# Patient Record
Sex: Male | Born: 1985 | Race: White | Hispanic: No | Marital: Married | Smoking: Never smoker
Health system: Southern US, Community
[De-identification: ages and names within clinical notes are randomized; demographics above are authoritative.]

---

## 2019-11-06 ENCOUNTER — Other Ambulatory Visit: Payer: Self-pay

## 2019-11-06 ENCOUNTER — Emergency Department (INDEPENDENT_AMBULATORY_CARE_PROVIDER_SITE_OTHER)
Admission: EM | Admit: 2019-11-06 | Discharge: 2019-11-06 | Disposition: A | Discharge status: Home / Self Care | Payer: PRIVATE HEALTH INSURANCE | Source: Home / Self Care | Attending: Family Medicine | Admitting: Family Medicine

## 2019-11-06 ENCOUNTER — Emergency Department (INDEPENDENT_AMBULATORY_CARE_PROVIDER_SITE_OTHER): Payer: PRIVATE HEALTH INSURANCE

## 2019-11-06 DIAGNOSIS — M545 Low back pain, unspecified: Secondary | ICD-10-CM

## 2019-11-06 DIAGNOSIS — S2232XA Fracture of one rib, left side, initial encounter for closed fracture: Secondary | ICD-10-CM | POA: Diagnosis not present

## 2019-11-06 DIAGNOSIS — G8929 Other chronic pain: Secondary | ICD-10-CM | POA: Diagnosis not present

## 2019-11-06 DIAGNOSIS — M542 Cervicalgia: Secondary | ICD-10-CM

## 2019-11-06 NOTE — ED Provider Notes (Signed)
[facility]    CSN: [id number] Arrival date & time: 11/06/19  0941      History   Chief Complaint Chief Complaint  Patient presents with  . Back Pain    HPI Benjamin Gibbs is a 34 y.o. male.   Patient presents with a variety of musculoskeletal complaints.  His main concerns are persistent recurring intermittent pain in his left neck/trapezius area, chronic pain in his left lateral chest and back that has become worse recently, and vague intermittent non-radiating pain in his left lower back.  He recalls no recent injury, but admits that he stretches and practices yoga daily.  He admits that he had shingles on his left chest about 8 years ago.     Past medical history: Bipolar disorder  Current problems:  none        Home Medications    Prior to Admission medications   None    Family History No family history on file.  Social History Social History   Tobacco Use  . Smoking status: Never Smoker  . Smokeless tobacco: Never Used  Substance Use Topics  . Alcohol use: Not Currently  . Drug use: Not on file     Allergies   Patient has no known allergies.   Review of Systems Review of Systems  Constitutional: Negative for activity change, appetite change, chills, diaphoresis, fatigue, fever and unexpected weight change.  HENT: Negative.   Eyes: Negative.   Respiratory: Negative.   Cardiovascular: Negative.   Gastrointestinal: Negative.   Genitourinary: Negative.   Musculoskeletal: Positive for back pain and neck pain. Negative for joint swelling.       Left rib pain  Skin: Negative for rash.  Neurological: Negative.      Physical Exam Triage Vital Signs ED Triage Vitals  Enc Vitals Group     BP 11/06/19 1017 138/72     Pulse Rate 11/06/19 1017 72     Resp 11/06/19 1017 18     Temp 11/06/19 1017 98.1 F (36.7 C)     Temp Source 11/06/19 1017 Oral     SpO2 11/06/19 1017 99 %     Weight 11/06/19 1024 174 lb (78.9 kg)     Height  11/06/19 1024 5\' 10"  (1.778 m)     Head Circumference --      Peak Flow --      Pain Score 11/06/19 1024 3     Pain Loc --      Pain Edu? --      Excl. in GC? --    No data found.  Updated Vital Signs BP 138/72 (BP Location: Left Arm)   Pulse 72   Temp 98.1 F (36.7 C) (Oral)   Resp 18   Ht 5\' 10"  (1.778 m)   Wt 78.9 kg   SpO2 99%   BMI 24.97 kg/m   Visual Acuity Right Eye Distance:   Left Eye Distance:   Bilateral Distance:    Right Eye Near:   Left Eye Near:    Bilateral Near:     Physical Exam Vitals and nursing note reviewed.  Constitutional:      General: He is not in acute distress. HENT:     Head: Normocephalic.     Right Ear: External ear normal.     Left Ear: External ear normal.     Nose: Nose normal.     Mouth/Throat:     Pharynx: Oropharynx is clear.  Eyes:     Pupils:  Pupils are equal, round, and reactive to light.  Neck:      Comments: Patient experiences pain in his left trapezius area, although there is no tenderness to palpation there.  He does have some cervical midline tenderness.  His pain is elicited by neck flexion to the left and rotating his head to the left. Cardiovascular:     Rate and Rhythm: Normal rate.     Heart sounds: Normal heart sounds.  Pulmonary:     Breath sounds: Normal breath sounds.    Chest:       Comments: Patient has localized tenderness to palpation over his left lateral chest. Abdominal:     Palpations: Abdomen is soft.     Tenderness: There is no abdominal tenderness.  Musculoskeletal:     Cervical back: Normal range of motion.     Right lower leg: No edema.     Left lower leg: No edema.     Comments: Back has full range of motion and no tenderness to palpation. Straight leg raise and sitting knee extension tests are negative. Patellar and achilles reflexes are normal.  Lymphadenopathy:     Cervical: No cervical adenopathy.  Skin:    General: Skin is warm and dry.     Findings: No rash.    Neurological:     Mental Status: He is alert.      UC Treatments / Results  Labs (all labs ordered are listed, but only abnormal results are displayed) Labs Reviewed - No data to display  EKG   Radiology DG Ribs Unilateral W/Chest Left  Result Date: 11/06/2019 CLINICAL DATA:  Patient is having posterior rib pain that started X 10 years ago. Patient states pain moves from posterior to lateral aspect of the left side. Denies SOB. Nonsmoker. No hx of asthma, COPD, CHF. Denies cough. EXAM: LEFT RIBS AND CHEST - 3+ VIEW COMPARISON:  None. FINDINGS: Subtle nondisplaced fracture of the lateral left tenth rib is noted. No other fractures.  No bone lesions. Normal heart, mediastinum and hila. Clear lungs. No pleural effusion or pneumothorax. IMPRESSION: 1. Subtle nondisplaced fracture of the lateral left tenth rib. 2. No other abnormalities.  No active cardiopulmonary disease. Electronically Signed   By: Lajean Manes M.D.   On: 11/06/2019 11:59   DG Cervical Spine Complete  Result Date: 11/06/2019 CLINICAL DATA:  Left neck pain radiating to left shoulder and upper arm for 2-3 years EXAM: CERVICAL SPINE - COMPLETE 4+ VIEW COMPARISON:  None. FINDINGS: There is no evidence of cervical spine fracture or prevertebral soft tissue swelling. Alignment is normal. No other significant bone abnormalities are identified. IMPRESSION: No fracture or static subluxation of the cervical spine. Disc spaces and vertebral body heights are preserved. Electronically Signed   By: Eddie Candle M.D.   On: 11/06/2019 12:04    Procedures Procedures (including critical care time)  Medications Ordered in UC Medications - No data to display  Initial Impression / Assessment and Plan / UC Course  I have reviewed the triage vital signs and the nursing notes.  Pertinent labs & imaging results that were available during my care of the patient were reviewed by me and considered in my medical decision making (see chart for  details).    Suspect left cervical radiculopathy. Rib belt applied to chest. Recommend further evaluation by Dr. Aundria Mems    Final Clinical Impressions(s) / UC Diagnoses   Final diagnoses:  Closed fracture of one rib of left side, initial encounter  Neck  pain on left side  Chronic left-sided low back pain without sciatica     Discharge Instructions     Wear rib belt sparingly (not constantly).  May take Tylenol as needed for pain.    ED Prescriptions    None        Lattie Haw, MD 11/08/19 1753

## 2019-11-06 NOTE — ED Triage Notes (Signed)
Pt c/o back pain since this weekend. Does yoga regularly. RT sided flank radiating from ribs. Had shingles at 80, not sure if related. Pain constant at 3/10.

## 2019-11-06 NOTE — Discharge Instructions (Addendum)
Wear rib belt sparingly (not constantly).  May take Tylenol as needed for pain.

## 2021-05-24 IMAGING — DX DG CERVICAL SPINE COMPLETE 4+V
5 series · 5 of 5 positions shown · non-contrast
Comparison: None.

CLINICAL DATA: Left neck pain radiating to left shoulder and upper
arm for 2-3 years

EXAM:
CERVICAL SPINE - COMPLETE 4+ VIEW

[c-spine lat]
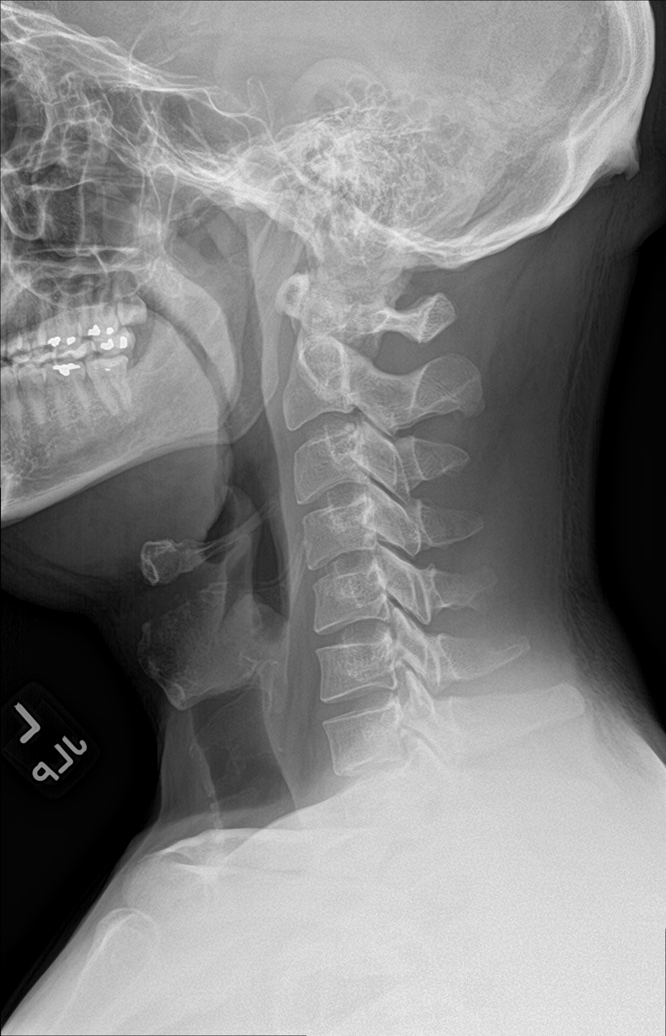

[c-spine obl (1 of 2)]
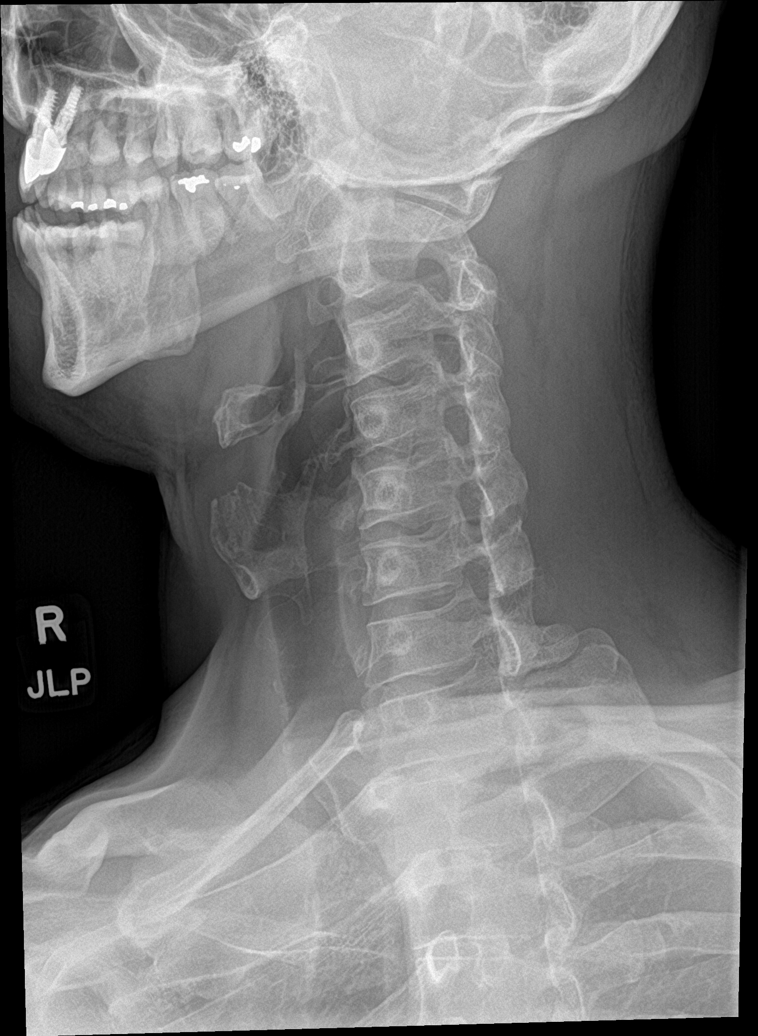

[c-spine ap]
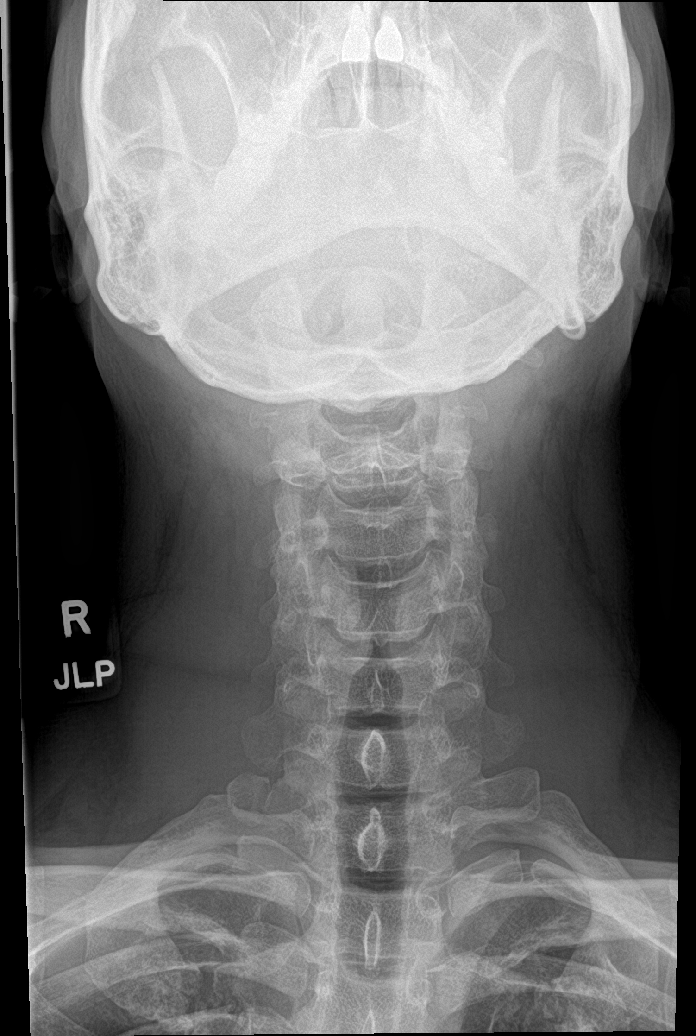

[c-spine open mouth]
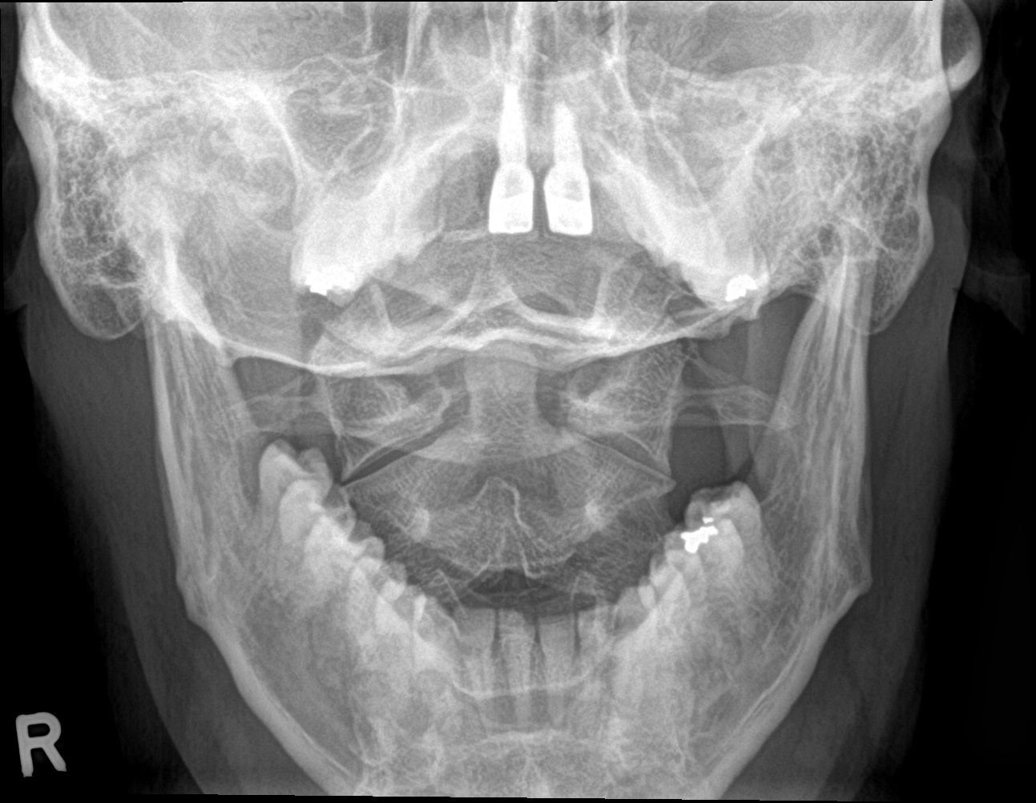

[c-spine obl (2 of 2)]
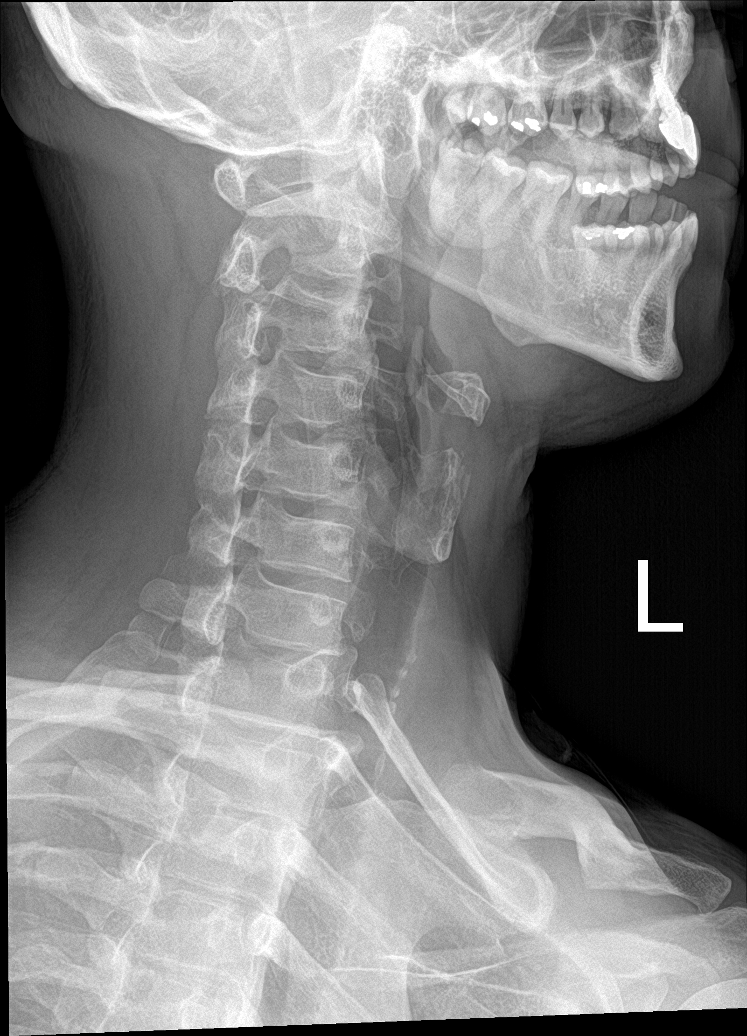

[5 of 5 positions shown; findings below may reference images not displayed]

FINDINGS: There is no evidence of cervical spine fracture or prevertebral soft
tissue swelling. Alignment is normal. No other significant bone
abnormalities are identified.
IMPRESSION: No fracture or static subluxation of the cervical spine. Disc spaces
and vertebral body heights are preserved.
